# Patient Record
Sex: Male | Born: 2000 | Race: Black or African American | Hispanic: No | Marital: Single | Smoking: Current every day smoker
Health system: Southern US, Community
[De-identification: ages and names within clinical notes are randomized; demographics above are authoritative.]

---
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
## 2024-08-29 ENCOUNTER — Encounter: Payer: Self-pay | Admitting: Nurse Practitioner

## 2024-08-29 ENCOUNTER — Ambulatory Visit: Payer: Self-pay | Admitting: Nurse Practitioner

## 2024-08-29 VITALS — BP 122/76 | Temp 97.9°F | Resp 16 | Ht 70.0 in | Wt 200.3 lb

## 2024-08-29 DIAGNOSIS — Z7689 Persons encountering health services in other specified circumstances: Secondary | ICD-10-CM

## 2024-08-29 DIAGNOSIS — S3994XA Unspecified injury of external genitals, initial encounter: Secondary | ICD-10-CM

## 2024-08-29 DIAGNOSIS — R1311 Dysphagia, oral phase: Secondary | ICD-10-CM | POA: Diagnosis not present

## 2024-08-29 DIAGNOSIS — Z114 Encounter for screening for human immunodeficiency virus [HIV]: Secondary | ICD-10-CM

## 2024-08-29 DIAGNOSIS — F902 Attention-deficit hyperactivity disorder, combined type: Secondary | ICD-10-CM

## 2024-08-29 DIAGNOSIS — Z1159 Encounter for screening for other viral diseases: Secondary | ICD-10-CM

## 2024-08-29 DIAGNOSIS — M545 Low back pain, unspecified: Secondary | ICD-10-CM

## 2024-08-29 DIAGNOSIS — Z13220 Encounter for screening for lipoid disorders: Secondary | ICD-10-CM

## 2024-08-29 DIAGNOSIS — Z113 Encounter for screening for infections with a predominantly sexual mode of transmission: Secondary | ICD-10-CM

## 2024-08-29 DIAGNOSIS — Z131 Encounter for screening for diabetes mellitus: Secondary | ICD-10-CM

## 2024-08-29 DIAGNOSIS — Z130 Encounter for screening for diseases of the blood and blood-forming organs and certain disorders involving the immune mechanism: Secondary | ICD-10-CM

## 2024-08-29 MED ORDER — AMPHETAMINE-DEXTROAMPHETAMINE 10 MG PO TABS: 10.0000 mg | ORAL_TABLET | Freq: Every day | ORAL | 0 refills | Status: AC

## 2024-08-29 MED ORDER — METHOCARBAMOL 500 MG PO TABS: 500.0000 mg | ORAL_TABLET | Freq: Two times a day (BID) | ORAL | 1 refills | Status: AC | PRN

## 2024-08-29 MED ORDER — NAPROXEN 500 MG PO TABS: 500.0000 mg | ORAL_TABLET | Freq: Two times a day (BID) | ORAL | 1 refills | Status: AC | PRN

## 2024-08-29 NOTE — Progress Notes (Signed)
 BP 122/76   Temp 97.9 F (36.6 C)   Resp 16   Ht 5' 10 (1.778 m)   Wt 200 lb 4.8 oz (90.9 kg)   BMI 28.74 kg/m    Subjective:    Patient ID: Jason Mckinney, male    DOB: 2001/07/06, 23 y.o.   MRN: [id number]  HPI: Jason Mckinney is a 23 y.o. male  Chief Complaint  Patient presents with   Establish Care   Back Pain    Works [employer] heavy boxes   dyspagia    States been going on for years   ADHD    Discuss getting back on meds   Penis Injury    States zipped penis in zipper and has scar that is having trouble healing   Dysphagia    States been going on for years   Discussed the use of AI scribe software for clinical note transcription with the patient, who gave verbal consent to proceed.  History of Present Illness Jason Mckinney is a 23 year old male who presents with lower back pain, dysphagia, and a penile injury.  Lower back pain - Lower back pain present for approximately two and a half years, coinciding with employment at [employer] involving heavy lifting - Pain described as 'really bad' and 'stiff', localized to the lower right side of the back - Pain exacerbated by heavy lifting - Two urgent care visits for this issue, approximately one year apart - Ibuprofen used for pain relief with minimal effect - No history of physical therapy or use of a back brace  Dysphagia - Difficulty swallowing dry foods, such as crackers, without drinking water - Symptoms present since middle or high school - Intermittent episodes where swallowing is more difficult, requiring increased concentration - Occasional sensation of food getting stuck - Able to eat full meals at times without difficulty  Penile injury - Penile injury occurred 8-9 months ago due to accidental zipper entrapment - Persistent scab at the injury site, with incomplete healing and no return to normal skin - No pain or drainage from the site - Injury is noticeable to his partner during intercourse    ADHD -was being treated for ADHD until recently.  Tried to stop taking medication but has noticed signs that he can not stop.  He is having a hard time focusing and getting tasks completed. Will restart adderall 10 mg daily.       08/29/2024   11:24 AM  Depression screen PHQ 2/9  Decreased Interest 0  Down, Depressed, Hopeless 0  PHQ - 2 Score 0    Relevant past medical, surgical, family and social history reviewed and updated as indicated. Interim medical history since our last visit reviewed. Allergies and medications reviewed and updated.  Review of Systems  Constitutional: Negative for fever or weight change.  Respiratory: Negative for cough and shortness of breath.   Cardiovascular: Negative for chest pain or palpitations.  Gastrointestinal: Negative for abdominal pain, no bowel changes.  Musculoskeletal: Negative for gait problem or joint swelling.  Skin: Negative for rash.  Neurological: Negative for dizziness or headache.  No other specific complaints in a complete review of systems (except as listed in HPI above).      Objective:      BP 122/76   Temp 97.9 F (36.6 C)   Resp 16   Ht 5' 10 (1.778 m)   Wt 200 lb 4.8 oz (90.9 kg)   BMI 28.74 kg/m  Wt Readings from Last 3 Encounters:  08/29/24 200 lb 4.8 oz (90.9 kg)    Physical Exam VITALS: BP- 122/76 MEASUREMENTS: Weight- 200, BMI- 28.74. GENERAL: Alert, cooperative, well developed, no acute distress. HEENT: Normocephalic, normal oropharynx, moist mucous membranes. CHEST: Clear to auscultation bilaterally, no wheezes, rhonchi, or crackles. CARDIOVASCULAR: Normal heart rate and rhythm, S1 and S2 normal without murmurs. ABDOMEN: Soft, non-tender, non-distended, without organomegaly, normal bowel sounds. GENITOURINARY: Penis with scabbing, not fully healed. EXTREMITIES: No cyanosis or edema. MUSCULOSKELETAL: Spine normal. NEUROLOGICAL: Cranial nerves grossly intact, moves all extremities without gross motor  or sensory deficit.  No results found for this or any previous visit.        Assessment & Plan:   Problem List Items Addressed This Visit       Other   Attention deficit hyperactivity disorder (ADHD), combined type - Primary   Relevant Medications   amphetamine -dextroamphetamine  (ADDERALL) 10 MG tablet   amphetamine -dextroamphetamine  (ADDERALL) 10 MG tablet (Start on 09/28/2024)   amphetamine -dextroamphetamine  (ADDERALL) 10 MG tablet (Start on 10/28/2024)   Other Visit Diagnoses       Encounter to establish care         Oral phase dysphagia       Relevant Orders   DG ESOPHAGUS W DOUBLE CM (HD)   TSH     Lumbar back pain       Relevant Medications   naproxen (NAPROSYN) 500 MG tablet   methocarbamol (ROBAXIN) 500 MG tablet     Injury to penis, initial encounter       Relevant Orders   Ambulatory referral to Urology     Screening for deficiency anemia       Relevant Orders   CBC with Differential/Platelet     Screening for cholesterol level       Relevant Orders   Lipid panel     Screening for diabetes mellitus       Relevant Orders   Comprehensive metabolic panel with GFR   Hemoglobin A1c     Screening for HIV without presence of risk factors       Relevant Orders   HIV Antibody (routine testing w rflx)     Encounter for hepatitis C screening test for low risk patient       Relevant Orders   Hepatitis C antibody     Screening examination for STD (sexually transmitted disease)       Relevant Orders   Hepatitis C antibody   RPR   HIV Antibody (routine testing w rflx)        Assessment and Plan Assessment & Plan Lower back pain Chronic lower back pain exacerbated by heavy lifting at work, described as stiffness and tightness in the lower right side, worsened by increased physical activity. No prior physical therapy attempted. - Prescribe Robaxin 500 mg twice daily as needed for back pain - Prescribe Naproxen 500 mg as needed for pain - If no improvement,  refer to physical therapy - Advise on proper lifting techniques and consider using a back brace  Dysphagia Chronic dysphagia with difficulty swallowing dry foods, occasional sensation of food being stuck. - Order upper GI series to evaluate swallowing difficulties -will consider referral to GI  Attention-deficit hyperactivity disorder (ADHD) ADHD previously managed with Adderall, considering resuming medication to aid focus. 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 No pain or drainage, but partner can feel the scab during intercourse. - Refer to urology for further evaluation and management  Establish care Establishing care with a new provider, overdue for lab work. - Order comprehensive blood work to establish baseline health status        Follow up plan: Return in about 3 months (around 11/29/2024) for follow up.

## 2024-08-30 LAB — COMPREHENSIVE METABOLIC PANEL WITH GFR
AG Ratio: 1.6 (calc) (ref 1.0–2.5)
ALT: 56 U/L — ABNORMAL HIGH (ref 9–46)
AST: 32 U/L (ref 10–40)
Albumin: 4.7 g/dL (ref 3.6–5.1)
Alkaline phosphatase (APISO): 75 U/L (ref 36–130)
BUN: 10 mg/dL (ref 7–25)
CO2: 28 mmol/L (ref 20–32)
Calcium: 9.7 mg/dL (ref 8.6–10.3)
Chloride: 104 mmol/L (ref 98–110)
Creat: 1.02 mg/dL (ref 0.60–1.24)
Globulin: 2.9 g/dL (ref 1.9–3.7)
Glucose, Bld: 76 mg/dL (ref 65–99)
Potassium: 4.1 mmol/L (ref 3.5–5.3)
Sodium: 140 mmol/L (ref 135–146)
Total Bilirubin: 0.3 mg/dL (ref 0.2–1.2)
Total Protein: 7.6 g/dL (ref 6.1–8.1)
eGFR: 107 mL/min/1.73m2 (ref >=60)

## 2024-08-30 LAB — HIV ANTIBODY (ROUTINE TESTING W REFLEX)
HIV 1&2 Ab, 4th Generation: NONREACTIVE
HIV FINAL INTERPRETATION: NEGATIVE

## 2024-08-30 LAB — LIPID PANEL
Cholesterol: 114 mg/dL (ref <200)
HDL: 50 mg/dL (ref >=40)
LDL Cholesterol (Calc): 40 mg/dL
Non-HDL Cholesterol (Calc): 64 mg/dL (ref <130)
Total CHOL/HDL Ratio: 2.3 (calc) (ref <5.0)
Triglycerides: 153 mg/dL — ABNORMAL HIGH (ref <150)

## 2024-08-30 LAB — CBC WITH DIFFERENTIAL/PLATELET
Absolute Lymphocytes: 1866 {cells}/uL (ref 850–3900)
Absolute Monocytes: 804 {cells}/uL (ref 200–950)
Basophils Absolute: 42 {cells}/uL (ref 0–200)
Basophils Relative: 0.9 %
Eosinophils Absolute: 99 {cells}/uL (ref 15–500)
Eosinophils Relative: 2.1 %
HCT: 43.4 % (ref 38.5–50.0)
Hemoglobin: 14.6 g/dL (ref 13.2–17.1)
MCH: 30 pg (ref 27.0–33.0)
MCHC: 33.6 g/dL (ref 32.0–36.0)
MCV: 89.1 fL (ref 80.0–100.0)
MPV: 11.1 fL (ref 7.5–12.5)
Monocytes Relative: 17.1 %
Neutro Abs: 1889 {cells}/uL (ref 1500–7800)
Neutrophils Relative %: 40.2 %
Platelets: 252 Thousand/uL (ref 140–400)
RBC: 4.87 Million/uL (ref 4.20–5.80)
RDW: 12.5 % (ref 11.0–15.0)
Total Lymphocyte: 39.7 %
WBC: 4.7 Thousand/uL (ref 3.8–10.8)

## 2024-08-30 LAB — HEMOGLOBIN A1C
Hgb A1c MFr Bld: 5.2 % (ref <5.7)
Mean Plasma Glucose: 103 mg/dL
eAG (mmol/L): 5.7 mmol/L

## 2024-08-30 LAB — HEPATITIS C ANTIBODY: Hepatitis C Ab: NONREACTIVE

## 2024-08-30 LAB — RPR: RPR Ser Ql: NONREACTIVE

## 2024-08-30 LAB — TSH: TSH: 2.11 m[IU]/L (ref 0.40–4.50)

## 2024-09-01 ENCOUNTER — Ambulatory Visit: Payer: Self-pay | Admitting: Nurse Practitioner

## 2024-09-25 ENCOUNTER — Ambulatory Visit: Admitting: Urology

## 2024-12-02 ENCOUNTER — Ambulatory Visit: Admitting: Nurse Practitioner
